# Patient Record
Sex: Female | Born: 1970 | Race: Black or African American | Hispanic: No | Marital: Single | Smoking: Never smoker
Health system: Southern US, Community
[De-identification: ages and names within clinical notes are randomized; demographics above are authoritative.]

---

## 1999-11-20 ENCOUNTER — Emergency Department (HOSPITAL_COMMUNITY): Admission: EM | Admit: 1999-11-20 | Discharge: 1999-11-20 | Payer: Self-pay | Admitting: Emergency Medicine

## 1999-11-20 ENCOUNTER — Encounter: Payer: Self-pay | Admitting: Internal Medicine

## 2000-08-19 ENCOUNTER — Emergency Department (HOSPITAL_COMMUNITY): Admission: EM | Admit: 2000-08-19 | Discharge: 2000-08-19 | Payer: Self-pay | Admitting: Emergency Medicine

## 2000-08-19 ENCOUNTER — Encounter: Payer: Self-pay | Admitting: Emergency Medicine

## 2000-08-22 ENCOUNTER — Emergency Department (HOSPITAL_COMMUNITY): Admission: EM | Admit: 2000-08-22 | Discharge: 2000-08-22 | Payer: Self-pay | Admitting: Emergency Medicine

## 2000-12-16 ENCOUNTER — Emergency Department (HOSPITAL_COMMUNITY): Admission: EM | Admit: 2000-12-16 | Discharge: 2000-12-16 | Payer: Self-pay | Admitting: *Deleted

## 2007-06-24 ENCOUNTER — Emergency Department (HOSPITAL_COMMUNITY): Admission: EM | Admit: 2007-06-24 | Discharge: 2007-06-24 | Payer: Self-pay | Admitting: Emergency Medicine
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
## 2014-03-27 ENCOUNTER — Encounter (HOSPITAL_COMMUNITY): Payer: Self-pay | Admitting: Emergency Medicine

## 2014-03-27 ENCOUNTER — Emergency Department (INDEPENDENT_AMBULATORY_CARE_PROVIDER_SITE_OTHER)
Admission: EM | Admit: 2014-03-27 | Discharge: 2014-03-27 | Disposition: A | Discharge status: Home / Self Care | Payer: Self-pay | Source: Home / Self Care | Attending: Family Medicine | Admitting: Family Medicine

## 2014-03-27 DIAGNOSIS — T6591XA Toxic effect of unspecified substance, accidental (unintentional), initial encounter: Secondary | ICD-10-CM

## 2014-03-27 DIAGNOSIS — T148 Other injury of unspecified body region: Secondary | ICD-10-CM

## 2014-03-27 DIAGNOSIS — L253 Unspecified contact dermatitis due to other chemical products: Secondary | ICD-10-CM

## 2014-03-27 DIAGNOSIS — L245 Irritant contact dermatitis due to other chemical products: Secondary | ICD-10-CM

## 2014-03-27 DIAGNOSIS — W57XXXA Bitten or stung by nonvenomous insect and other nonvenomous arthropods, initial encounter: Secondary | ICD-10-CM

## 2014-03-27 MED ORDER — METHYLPREDNISOLONE ACETATE 80 MG/ML IJ SUSP
INTRAMUSCULAR | Status: AC
  Filled 2014-03-27: qty 1

## 2014-03-27 MED ORDER — TRIAMCINOLONE ACETONIDE 40 MG/ML IJ SUSP
INTRAMUSCULAR | Status: AC
  Filled 2014-03-27: qty 1

## 2014-03-27 MED ORDER — METHYLPREDNISOLONE ACETATE 40 MG/ML IJ SUSP
80.0000 mg | Freq: Once | INTRAMUSCULAR | Status: AC
  Administered 2014-03-27: 80 mg via INTRAMUSCULAR

## 2014-03-27 MED ORDER — TRIAMCINOLONE ACETONIDE 40 MG/ML IJ SUSP
40.0000 mg | Freq: Once | INTRAMUSCULAR | Status: AC
  Administered 2014-03-27: 40 mg via INTRAMUSCULAR

## 2014-03-27 MED ORDER — PREDNISONE 50 MG PO TABS: ORAL_TABLET | ORAL | Status: DC

## 2014-03-27 MED ORDER — HYDROXYZINE HCL 25 MG PO TABS: 25.0000 mg | ORAL_TABLET | Freq: Four times a day (QID) | ORAL | Status: DC

## 2014-03-27 NOTE — ED Notes (Signed)
After treating her home w a spray for bedbugs, she has developed generalized skin problem w itchy rash

## 2014-03-27 NOTE — ED Provider Notes (Signed)
CSN: [id number]     Arrival date & time 03/27/14  4098 History   First MD Initiated Contact with Patient 03/27/14 [phone]     Chief Complaint  Patient presents with  . Rash   (Consider location/radiation/quality/duration/timing/severity/associated sxs/prior Treatment) Patient is a 43 y.o. female presenting with rash. The history is provided by the patient.  Rash Location:  Full body Quality: dryness and itchiness   Severity:  Mild Onset quality:  Gradual Duration:  2 days Progression:  Spreading Chronicity:  New Context comment:  Recent problem with bedbugs and used chemical spray for same and now rash is worse.   History reviewed. No pertinent past medical history. History reviewed. No pertinent past surgical history. History reviewed. No pertinent family history. History  Substance Use Topics  . Smoking status: Never Smoker   . Smokeless tobacco: Not on file  . Alcohol Use: No   OB History   Grav Para Term Preterm Abortions TAB SAB Ect Mult Living                 Review of Systems  Constitutional: Negative.   Skin: Positive for rash.    Allergies  Review of patient's allergies indicates no known allergies.  Home Medications   Prior to Admission medications   Not on File   BP 120/77  Pulse 75  Temp(Src) 98.2 F (36.8 C) (Oral)  Resp 16  SpO2 98% Physical Exam  Nursing note and vitals reviewed. Constitutional: She is oriented to person, place, and time. She appears well-developed and well-nourished.  Cardiovascular: Normal heart sounds.   Pulmonary/Chest: Effort normal and breath sounds normal. No respiratory distress.  Lymphadenopathy:    She has no cervical adenopathy.  Neurological: She is alert and oriented to person, place, and time.  Skin: Skin is warm and dry. Rash noted. There is erythema.  Scattered papular and confluent erythema to ext.     ED Course  Procedures (including critical care time) Labs Review Labs Reviewed - No data to  display  Imaging Review No results found.   MDM  No diagnosis found.     Linna Hoff, MD 03/27/14 1101

## 2019-08-25 ENCOUNTER — Ambulatory Visit (INDEPENDENT_AMBULATORY_CARE_PROVIDER_SITE_OTHER): Payer: Self-pay | Admitting: Primary Care

## 2019-08-25 ENCOUNTER — Encounter (INDEPENDENT_AMBULATORY_CARE_PROVIDER_SITE_OTHER): Payer: Self-pay | Admitting: Primary Care

## 2019-08-25 ENCOUNTER — Other Ambulatory Visit: Payer: Self-pay

## 2019-08-25 VITALS — BP 156/103 | HR 78 | Temp 97.5°F | Ht 68.5 in | Wt 388.4 lb

## 2019-08-25 DIAGNOSIS — Z114 Encounter for screening for human immunodeficiency virus [HIV]: Secondary | ICD-10-CM

## 2019-08-25 DIAGNOSIS — Z7689 Persons encountering health services in other specified circumstances: Secondary | ICD-10-CM

## 2019-08-25 DIAGNOSIS — Z0000 Encounter for general adult medical examination without abnormal findings: Secondary | ICD-10-CM

## 2019-08-25 DIAGNOSIS — M79605 Pain in left leg: Secondary | ICD-10-CM

## 2019-08-25 DIAGNOSIS — Z6841 Body Mass Index (BMI) 40.0 and over, adult: Secondary | ICD-10-CM

## 2019-08-25 DIAGNOSIS — Z131 Encounter for screening for diabetes mellitus: Secondary | ICD-10-CM

## 2019-08-25 DIAGNOSIS — M79604 Pain in right leg: Secondary | ICD-10-CM

## 2019-08-25 DIAGNOSIS — R030 Elevated blood-pressure reading, without diagnosis of hypertension: Secondary | ICD-10-CM

## 2019-08-25 DIAGNOSIS — Z124 Encounter for screening for malignant neoplasm of cervix: Secondary | ICD-10-CM

## 2019-08-25 DIAGNOSIS — Z1231 Encounter for screening mammogram for malignant neoplasm of breast: Secondary | ICD-10-CM

## 2019-08-25 DIAGNOSIS — Z1211 Encounter for screening for malignant neoplasm of colon: Secondary | ICD-10-CM

## 2019-08-25 LAB — POCT GLYCOSYLATED HEMOGLOBIN (HGB A1C): Hemoglobin A1C: 5.4 % (ref 4.0–5.6)

## 2019-08-25 MED ORDER — IBUPROFEN 800 MG PO TABS: 600.0000 mg | ORAL_TABLET | Freq: Three times a day (TID) | ORAL | 1 refills | Status: DC | PRN

## 2019-08-25 MED ORDER — HYDROCHLOROTHIAZIDE 25 MG PO TABS: 25.0000 mg | ORAL_TABLET | Freq: Every day | ORAL | 3 refills | Status: DC

## 2019-08-25 NOTE — Patient Instructions (Signed)

## 2019-08-25 NOTE — Progress Notes (Signed)
New Patient Office Visit  Subjective:  Patient ID: Tonya Barrett, female    DOB: [birth year]  Age: 49 y.o. MRN: [id number]  CC:  Chief Complaint  Patient presents with  . New Patient (Initial Visit)    pain in legs and shortness of breath     HPI Tonya Barrett presents for establishment of care and voices concerns with shortness of breath and bilateral leg pain.  History reviewed. No pertinent past medical history.  History reviewed. No pertinent surgical history.  Family History  Problem Relation Age of Onset  . Diabetes Mother   . Diabetes Sister     Social History   Socioeconomic History  . Marital status: Single    Spouse name: Not on file  . Number of children: Not on file  . Years of education: Not on file  . Highest education level: Not on file  Occupational History  . Not on file  Tobacco Use  . Smoking status: Never Smoker  . Smokeless tobacco: Never Used  Substance and Sexual Activity  . Alcohol use: No  . Drug use: Never  . Sexual activity: Not Currently  Other Topics Concern  . Not on file  Social History Narrative  . Not on file   Social Determinants of Health   Financial Resource Strain:   . Difficulty of Paying Living Expenses: Not on file  Food Insecurity:   . Worried About Charity fundraiser in the Last Year: Not on file  . Ran Out of Food in the Last Year: Not on file  Transportation Needs:   . Lack of Transportation (Medical): Not on file  . Lack of Transportation (Non-Medical): Not on file  Physical Activity:   . Days of Exercise per Week: Not on file  . Minutes of Exercise per Session: Not on file  Stress:   . Feeling of Stress : Not on file  Social Connections:   . Frequency of Communication with Friends and Family: Not on file  . Frequency of Social Gatherings with Friends and Family: Not on file  . Attends Religious Services: Not on file  . Active Member of Clubs or Organizations: Not on file  . Attends Theatre manager Meetings: Not on file  . Marital Status: Not on file  Intimate Partner Violence:   . Fear of Current or Ex-Partner: Not on file  . Emotionally Abused: Not on file  . Physically Abused: Not on file  . Sexually Abused: Not on file    ROS Review of Systems  Respiratory: Positive for shortness of breath.   Musculoskeletal:       Leg pain  All other systems reviewed and are negative.   Objective:   Today's Vitals: BP (!) 156/103 (BP Location: Right Arm, Patient Position: Sitting, Cuff Size: Large)   Pulse 78   Temp (!) 97.5 F (36.4 C) (Temporal)   Ht 5' 8.5" (1.74 m)   Wt (!) 388 lb 6.4 oz (176.2 kg)   LMP 06/17/2019 (Exact Date)   SpO2 95%   BMI 58.19 kg/m   Physical Exam Vitals reviewed.  Constitutional:      Appearance: Normal appearance. She is obese.  HENT:     Head: Normocephalic.     Right Ear: Tympanic membrane normal.     Left Ear: Tympanic membrane normal.  Cardiovascular:     Rate and Rhythm: Normal rate and regular rhythm.  Pulmonary:     Effort: Pulmonary effort is normal.  Breath sounds: Normal breath sounds.  Abdominal:     General: Bowel sounds are normal.     Palpations: Abdomen is soft.  Musculoskeletal:        General: Normal range of motion.     Cervical back: Normal range of motion and neck supple.  Skin:    General: Skin is warm and dry.  Neurological:     Mental Status: She is alert and oriented to person, place, and time.  Psychiatric:        Mood and Affect: Mood normal.        Behavior: Behavior normal.        Thought Content: Thought content normal.        Judgment: Judgment normal.     Assessment & Plan:  Tonya Barrett was seen today for new patient (initial visit).  Diagnoses and all orders for this visit:  Screening for diabetes mellitus -     HgB A1c 5.4 -     CBC with Differential  Encounter to establish care Tonya Mire, NP-C will be your  (PCP) she is mastered prepared . She is skilled to diagnosed and  treat illness. Also able to answer health concern as well as continuing care of varied medical conditions, not limited by cause, organ system, or diagnosis.   Bilateral leg pain Work on losing weight to help reduce joint pain. May alternate with heat and ice application for pain relief. May also alternate with acetaminophen and Ibuprofen as prescribed pain relief. Other alternatives include massage, acupuncture and water aerobics.  You must stay active and avoid a sedentary lifestyle.  Morbid obesity ([place]) Morbid Obesity is </= 40  BMI indicating an excess in caloric intake or underlining conditions. This may lead to other co-morbidities increase risk for diabetes, CVD, respiratory complications and hypertension . Lifestyle modifications of diet and exercise may reduce obesity.  -     Lipid Panel -     TSH + free T4  Elevated blood pressure reading without diagnosis of hypertension Discussed measures that can lower blood pressure </= 130/80. Low sodiums diet , snacks  low-sodium, DASH diet, 150 minutes of moderate intensity exercise per week. Discussed medication compliance, adverse effects.  -     CMP14+EGFR  Colon cancer screening Normal colon cancer screening.  CDC recommends colorectal screening from ages 22-75. Screening can begin at 38 or earlier in some cases. This screening is used for a disease when no symptoms are present . Diagnostic test is used for symptoms examples blood in stool, colorectal polyps or coloector cancer, family history or inflammatory bowel disease - chron's or ulcerative colitis .(USPSTF) -     Fecal occult blood, imunochemical; Future  Encounter for screening mammogram for malignant neoplasm of breast Patient completed application for BCCP while in clinic and application has and fax[facility] to [facility]. Patient aware that [facility] will contact her directly to schedule appointment.  Screening for cervical cancer Patient completed application for BCCP while in clinic and  application has and faxed to [facility]. Patient aware that [facility] will contact her directly to schedule appointment.  Encounter for screening for HIV Quality metric  -     HIV Antibody (routine testing w rflx)  Other orders -     hydrochlorothiazide (HYDRODIURIL) 25 MG tablet; Take 1 tablet (25 mg total) by mouth daily. -     ibuprofen (ADVIL) 800 MG tablet; Take 1 tablet (800 mg total) by mouth every 8 (eight) hours as needed.    Follow-up: Return in  about 4 weeks (around 09/22/2019) for tele Bp follow up.   Kerin Perna, NP

## 2019-08-26 LAB — CMP14+EGFR
ALT: 26 IU/L (ref 0–32)
AST: 25 IU/L (ref 0–40)
Albumin/Globulin Ratio: 1.3 (ref 1.2–2.2)
Albumin: 4.3 g/dL (ref 3.8–4.8)
Alkaline Phosphatase: 81 IU/L (ref 39–117)
BUN/Creatinine Ratio: 11 (ref 9–23)
BUN: 9 mg/dL (ref 6–24)
Bilirubin Total: 0.8 mg/dL (ref 0.0–1.2)
CO2: 19 mmol/L — ABNORMAL LOW (ref 20–29)
Calcium: 9.5 mg/dL (ref 8.7–10.2)
Chloride: 102 mmol/L (ref 96–106)
Creatinine, Ser: 0.79 mg/dL (ref 0.57–1.00)
GFR calc Af Amer: 101 mL/min/{1.73_m2} (ref >59)
GFR calc non Af Amer: 88 mL/min/{1.73_m2} (ref >59)
Globulin, Total: 3.3 g/dL (ref 1.5–4.5)
Glucose: 116 mg/dL — ABNORMAL HIGH (ref 65–99)
Potassium: 4.2 mmol/L (ref 3.5–5.2)
Sodium: 140 mmol/L (ref 134–144)
Total Protein: 7.6 g/dL (ref 6.0–8.5)

## 2019-08-26 LAB — TSH+FREE T4
Free T4: 0.98 ng/dL (ref 0.82–1.77)
TSH: 2 u[IU]/mL (ref 0.450–4.500)

## 2019-08-26 LAB — CBC WITH DIFFERENTIAL/PLATELET
Basophils Absolute: 0 10*3/uL (ref 0.0–0.2)
Basos: 0 %
EOS (ABSOLUTE): 0.2 10*3/uL (ref 0.0–0.4)
Eos: 3 %
Hematocrit: 39.8 % (ref 34.0–46.6)
Hemoglobin: 13.7 g/dL (ref 11.1–15.9)
Immature Grans (Abs): 0 10*3/uL (ref 0.0–0.1)
Immature Granulocytes: 1 %
Lymphocytes Absolute: 1.9 10*3/uL (ref 0.7–3.1)
Lymphs: 33 %
MCH: 30.5 pg (ref 26.6–33.0)
MCHC: 34.4 g/dL (ref 31.5–35.7)
MCV: 89 fL (ref 79–97)
Monocytes Absolute: 0.6 10*3/uL (ref 0.1–0.9)
Monocytes: 11 %
Neutrophils Absolute: 3 10*3/uL (ref 1.4–7.0)
Neutrophils: 52 %
Platelets: 311 10*3/uL (ref 150–450)
RBC: 4.49 x10E6/uL (ref 3.77–5.28)
RDW: 14.4 % (ref 11.7–15.4)
WBC: 5.8 10*3/uL (ref 3.4–10.8)

## 2019-08-26 LAB — LIPID PANEL
Chol/HDL Ratio: 4.6 ratio — ABNORMAL HIGH (ref 0.0–4.4)
Cholesterol, Total: 267 mg/dL — ABNORMAL HIGH (ref 100–199)
HDL: 58 mg/dL (ref >39)
LDL Chol Calc (NIH): 187 mg/dL — ABNORMAL HIGH (ref 0–99)
Triglycerides: 121 mg/dL (ref 0–149)
VLDL Cholesterol Cal: 22 mg/dL (ref 5–40)

## 2019-08-26 LAB — HIV ANTIBODY (ROUTINE TESTING W REFLEX): HIV Screen 4th Generation wRfx: NONREACTIVE

## 2019-08-26 NOTE — Addendum Note (Signed)
Addended by: Mirian Mo on: 08/26/2019 01:42 PM   Modules accepted: Orders

## 2019-08-27 LAB — FECAL OCCULT BLOOD, IMMUNOCHEMICAL: Fecal Occult Bld: NEGATIVE

## 2019-08-29 ENCOUNTER — Other Ambulatory Visit (INDEPENDENT_AMBULATORY_CARE_PROVIDER_SITE_OTHER): Payer: Self-pay | Admitting: Primary Care

## 2019-08-29 ENCOUNTER — Telehealth (INDEPENDENT_AMBULATORY_CARE_PROVIDER_SITE_OTHER): Payer: Self-pay

## 2019-08-29 MED ORDER — ATORVASTATIN CALCIUM 40 MG PO TABS: 40.0000 mg | ORAL_TABLET | Freq: Every day | ORAL | 1 refills | Status: DC

## 2019-08-29 NOTE — Telephone Encounter (Signed)
Patient verified date of birth. She is aware that labs were normal except elevated cholesterol which can lead to stroke and heart attack. She is aware that atorvastatin has been sent to her pharmacy to help lower her cholesterol. She was also advised on dietary changes to make such as decreasing fatty food, red meat, cheese and milk and increase whole grains and vegetables. She verbalized understanding of results. Maryjean Morn, CMA

## 2019-08-29 NOTE — Telephone Encounter (Signed)
-----   Message from Grayce Sessions, NP sent at 08/29/2019 11:58 AM EST ----- Reviewed  labs all normal except .Cholesterol that can lead to heart attack and stroke. To lower your number you can decrease your fatty foods, red meat, cheese, milk and increase fiber like whole grains and veggies. Sent in Atorvastatin 40 mg Qpm

## 2019-09-22 ENCOUNTER — Other Ambulatory Visit: Payer: Self-pay

## 2019-09-22 ENCOUNTER — Encounter (INDEPENDENT_AMBULATORY_CARE_PROVIDER_SITE_OTHER): Payer: Self-pay | Admitting: Primary Care

## 2019-09-22 ENCOUNTER — Ambulatory Visit (INDEPENDENT_AMBULATORY_CARE_PROVIDER_SITE_OTHER): Payer: Self-pay | Admitting: Primary Care

## 2019-09-22 DIAGNOSIS — M79604 Pain in right leg: Secondary | ICD-10-CM

## 2019-09-22 DIAGNOSIS — M79605 Pain in left leg: Secondary | ICD-10-CM

## 2019-09-22 NOTE — Progress Notes (Signed)
Pt has not checked Bp this am Pt has to purchase Bp cuff

## 2019-09-22 NOTE — Progress Notes (Signed)
Virtual Visit via Telephone Note  I connected with Tonya Barrett on 09/22/19 at  9:50 AM EST by telephone and verified that I am speaking with the correct person using two identifiers.   I discussed the limitations, risks, security and privacy concerns of performing an evaluation and management service by telephone and the availability of in person appointments. I also discussed with the patient that there may be a patient responsible charge related to this service. The patient expressed understanding and agreed to proceed.   History of Present Illness: Tonya Barrett is having a tele visit follow up of blood pressure . Unable to check Bp has not purchase a blood pressure monitor. She is on HCTZ 25mg  . She does complain of bilateral leg pain increasing pain when standing a long period of time. We discussed weight can be a factor. No past medical history on file.  Current Outpatient Medications on File Prior to Visit  Medication Sig Dispense Refill  . atorvastatin (LIPITOR) 40 MG tablet Take 1 tablet (40 mg total) by mouth daily. 90 tablet 1  . hydrochlorothiazide (HYDRODIURIL) 25 MG tablet Take 1 tablet (25 mg total) by mouth daily. 90 tablet 3  . ibuprofen (ADVIL) 800 MG tablet Take 1 tablet (800 mg total) by mouth every 8 (eight) hours as needed. 90 tablet 1   No current facility-administered medications on file prior to visit.   Observations/Objective: Review of Systems  Musculoskeletal: Positive for joint pain.       Bilateral knee pain  All other systems reviewed and are negative.   Assessment and Plan: Tonya Barrett was seen today for blood pressure check.  Diagnoses and all orders for this visit:  Morbid obesity (HCC) Morbid Obesity is >/= 40  indicating an excess in caloric intake or underlining conditions. This may lead to other co-morbidities-HTN, DM and respiratory complications  Lifestyle modifications of diet and exercise may reduce obesity.   Bilateral leg pain Work  on losing weight to help reduce joint pain. May alternate with heat and ice application for pain relief. May also alternate with acetaminophen and Ibuprofen as prescribed pain relief. Other alternatives include massage, acupuncture and water aerobics.  You must stay active and avoid a sedentary lifestyle.    Follow Up Instructions:    I discussed the assessment and treatment plan with the patient. The patient was provided an opportunity to ask questions and all were answered. The patient agreed with the plan and demonstrated an understanding of the instructions.   The patient was advised to call back or seek an in-person evaluation if the symptoms worsen or if the condition fails to improve as anticipated.  I provided 10 minutes of non-face-to-face time during this encounter.   Aram Beecham, NP

## 2019-10-16 ENCOUNTER — Ambulatory Visit (INDEPENDENT_AMBULATORY_CARE_PROVIDER_SITE_OTHER): Payer: Self-pay | Admitting: Primary Care

## 2019-10-30 ENCOUNTER — Ambulatory Visit (INDEPENDENT_AMBULATORY_CARE_PROVIDER_SITE_OTHER): Payer: Self-pay | Admitting: Primary Care

## 2020-02-10 ENCOUNTER — Other Ambulatory Visit: Payer: Self-pay

## 2020-02-10 ENCOUNTER — Encounter (INDEPENDENT_AMBULATORY_CARE_PROVIDER_SITE_OTHER): Payer: Self-pay | Admitting: Primary Care

## 2020-02-10 ENCOUNTER — Ambulatory Visit (INDEPENDENT_AMBULATORY_CARE_PROVIDER_SITE_OTHER): Payer: Self-pay | Admitting: Primary Care

## 2020-02-10 VITALS — BP 128/87 | HR 74 | Temp 98.0°F | Ht 68.0 in | Wt 385.2 lb

## 2020-02-10 DIAGNOSIS — E662 Morbid (severe) obesity with alveolar hypoventilation: Secondary | ICD-10-CM

## 2020-02-10 DIAGNOSIS — R062 Wheezing: Secondary | ICD-10-CM

## 2020-02-10 DIAGNOSIS — Z23 Encounter for immunization: Secondary | ICD-10-CM

## 2020-02-10 DIAGNOSIS — N921 Excessive and frequent menstruation with irregular cycle: Secondary | ICD-10-CM

## 2020-02-10 DIAGNOSIS — I10 Essential (primary) hypertension: Secondary | ICD-10-CM

## 2020-02-10 MED ORDER — HYDROCHLOROTHIAZIDE 25 MG PO TABS: 25.0000 mg | ORAL_TABLET | Freq: Every day | ORAL | 3 refills | Status: DC

## 2020-02-10 MED ORDER — IBUPROFEN 800 MG PO TABS: 800.0000 mg | ORAL_TABLET | Freq: Three times a day (TID) | ORAL | 1 refills | Status: DC | PRN

## 2020-02-10 MED ORDER — ALBUTEROL SULFATE HFA 108 (90 BASE) MCG/ACT IN AERS: 2.0000 | INHALATION_SPRAY | Freq: Four times a day (QID) | RESPIRATORY_TRACT | 1 refills | Status: DC | PRN

## 2020-02-10 NOTE — Patient Instructions (Signed)
Obesity Hypoventilation Syndrome  Obesity hypoventilation syndrome (OHS) means that you are not breathing well enough to get air in and out of your lungs efficiently (ventilation). This causes a low oxygen level and a high carbon dioxide level in your blood (hypoventilation). Having too much total body fat (obesity) is a significant risk factor for developing OHS. OHS makes it harder for your heart to pump oxygen-rich blood to your body. It can cause sleep disturbances and make you feel sleepy during the day. Over time, OHS can increase your risk for:  Heart disease.  High blood pressure (hypertension).  Reduced ability to absorb sugar from the bloodstream (insulin resistance).  Heart failure. Over time, OHS weakens your heart and can lead to heart failure. What are the causes? The exact cause of OHS is not known. Possible causes include:  Pressure on the lungs from excess body weight.  Obesity-related changes in how much air the lungs can hold (lung capacity) and how much they can expand (lung compliance).  Failure of the brain to regulate oxygen and carbon dioxide levels properly.  Chemicals (hormones) produced by excess fat cells interfering with breathing regulation.  A breathing condition in which breathing pauses or becomes shallow during sleep (sleep apnea). This condition can eventually cause the body to ventilate poorly and to hold onto carbon dioxide during the day. What increases the risk? You may have a greater risk for OHS if you:  Have a BMI of 30 or higher. BMI is an estimate of body fat that is calculated from height and weight. For adults, a BMI of 30 or higher is considered obese.  Are 40?51 years old.  Carry most of your excess weight around your waist.  Experience moderate symptoms of sleep apnea. What are the signs or symptoms? The most common symptoms of OHS are:  Daytime sleepiness.  Lack of energy.  Shortness of breath.  Morning headaches.  Sleep  apnea.  Trouble concentrating.  Irritability, mood swings, or depression.  Swollen veins in the neck.  Swelling of the legs. How is this diagnosed? Your health care provider may suspect OHS if you are obese and have poor breathing during the day and at night. Your health care provider will also do a physical exam. You may have tests to:  Measure your BMI.  Measure your blood oxygen level with a sensor placed on your finger (pulse oximetry).  Measure blood oxygen and carbon dioxide in a blood sample.  Measure the amount of red blood cells in a blood sample. OHS causes the number of red blood cells you have to increase (polycythemia).  Check your breathing ability (pulmonary function testing).  Check your breathing ability, breathing patterns, and oxygen level while you sleep (sleep study). You may also have a chest X-ray to rule out other breathing problems. You may have an electrocardiogram (ECG) and or echocardiogram to check for signs of heart failure. How is this treated? Weight loss is the most important part of treatment for OHS, and it may be the only treatment that you need. Other treatments may include:  Using a device to open your airway while you sleep, such as a continuous positive airway pressure (CPAP) machine that delivers oxygen to your airway through a mask.  Surgery (gastric bypass surgery) to lower your BMI. This may be needed if: ? You are very obese. ? Other treatments have not worked for you. ? Your OHS is very severe and is causing organ damage, such as heart failure. Follow these   instructions at home:  Medicines  Take over-the-counter and prescription medicines only as told by your health care provider.  Ask your health care provider what medicines are safe for you. You may be told to avoid medicines that can impair breathing and make OHS worse, such as sedatives and narcotics. Sleeping habits  If you are prescribed a CPAP machine, make sure you  understand and use the machine as directed.  Try to get 8 hours of sleep every night.  Go to bed at the same time every night, and get up at the same time every day. General instructions  Work with your health care provider to make a diet and exercise plan that helps you reach and maintain a healthy weight.  Eat a healthy diet.  Avoid smoking.  Exercise regularly as told by your health care provider.  During the evening, do not drink caffeine and do not eat heavy meals.  Keep all follow-up visits as told by your health care provider. This is important. Contact a health care provider if:  You experience new or worsening shortness of breath.  You have chest pain.  You have an irregular heartbeat (palpitations).  You have dizziness.  You faint.  You develop a cough.  You have a fever.  You have chest pain when you breathe (pleurisy). This information is not intended to replace advice given to you by your health care provider. Make sure you discuss any questions you have with your health care provider. Document Revised: 10/25/2018 Document Reviewed: 12/13/2015 Elsevier Patient Education  2020 [employer]  

## 2020-02-10 NOTE — Progress Notes (Signed)
Acute Office Visit  Subjective:    Patient ID: Tonya Barrett, female    DOB: 1970/11/15, 49 y.o.   MRN: [id number]  Chief Complaint  Patient presents with  . Wheezing  . Leg Pain    HPI Tonya Barrett is a 49 year old morbid obese female presents  with wheezing , shortness of breath and bilateral  leg pain.  Hypertension is close to goal denies  headaches, chest pain or lower extremity edema   No past medical history on file.  No past surgical history on file.  Family History  Problem Relation Age of Onset  . Diabetes Mother   . Diabetes Sister     Social History   Socioeconomic History  . Marital status: Single    Spouse name: Not on file  . Number of children: Not on file  . Years of education: Not on file  . Highest education level: Not on file  Occupational History  . Not on file  Tobacco Use  . Smoking status: Never Smoker  . Smokeless tobacco: Never Used  Substance and Sexual Activity  . Alcohol use: No  . Drug use: Never  . Sexual activity: Not Currently  Other Topics Concern  . Not on file  Social History Narrative  . Not on file   Social Determinants of Health   Financial Resource Strain:   . Difficulty of Paying Living Expenses:   Food Insecurity:   . Worried About Charity fundraiser in the Last Year:   . Arboriculturist in the Last Year:   Transportation Needs:   . Film/video editor (Medical):   Marland Kitchen Lack of Transportation (Non-Medical):   Physical Activity:   . Days of Exercise per Week:   . Minutes of Exercise per Session:   Stress:   . Feeling of Stress :   Social Connections:   . Frequency of Communication with Friends and Family:   . Frequency of Social Gatherings with Friends and Family:   . Attends Religious Services:   . Active Member of Clubs or Organizations:   . Attends Archivist Meetings:   Marland Kitchen Marital Status:   Intimate Partner Violence:   . Fear of Current or Ex-Partner:   . Emotionally Abused:    Marland Kitchen Physically Abused:   . Sexually Abused:     Outpatient Medications Prior to Visit  Medication Sig Dispense Refill  . atorvastatin (LIPITOR) 40 MG tablet Take 1 tablet (40 mg total) by mouth daily. 90 tablet 1  . hydrochlorothiazide (HYDRODIURIL) 25 MG tablet Take 1 tablet (25 mg total) by mouth daily. 90 tablet 3  . ibuprofen (ADVIL) 800 MG tablet Take 1 tablet (800 mg total) by mouth every 8 (eight) hours as needed. 90 tablet 1   No facility-administered medications prior to visit.    No Known Allergies  Review of Systems  Respiratory: Positive for cough, shortness of breath and wheezing.   All other systems reviewed and are negative.      Objective:    Physical Exam Vitals reviewed.  Constitutional:      Comments: morbid  HENT:     Head: Normocephalic.     Right Ear: Tympanic membrane normal.     Left Ear: Tympanic membrane normal.  Eyes:     Extraocular Movements: Extraocular movements intact.     Pupils: Pupils are equal, round, and reactive to light.  Cardiovascular:     Rate and Rhythm: Normal rate and regular rhythm.  Pulses: Normal pulses.     Heart sounds: Normal heart sounds.  Pulmonary:     Effort: Pulmonary effort is normal.     Breath sounds: Normal breath sounds.  Abdominal:     General: Bowel sounds are normal.  Musculoskeletal:        General: Swelling present. Normal range of motion.  Skin:    General: Skin is warm and dry.  Neurological:     Mental Status: She is alert and oriented to person, place, and time.  Psychiatric:        Mood and Affect: Mood normal.        Behavior: Behavior normal.        Thought Content: Thought content normal.        Judgment: Judgment normal.     Ht 5' 8"  (1.727 m)   Wt (!) 385 lb 3.2 oz (174.7 kg)   BMI 58.57 kg/m  Wt Readings from Last 3 Encounters:  02/10/20 (!) 385 lb 3.2 oz (174.7 kg)  08/25/19 (!) 388 lb 6.4 oz (176.2 kg)    Health Maintenance Due  Topic Date Due  . Hepatitis C Screening   Never done  . COVID-19 Vaccine (1) Never done  . TETANUS/TDAP  Never done  . PAP SMEAR-Modifier  Never done  . MAMMOGRAM  Never done  . COLONOSCOPY  Never done    There are no preventive care reminders to display for this patient.   Lab Results  Component Value Date   TSH 2.000 08/25/2019   Lab Results  Component Value Date   WBC 5.8 08/25/2019   HGB 13.7 08/25/2019   HCT 39.8 08/25/2019   MCV 89 08/25/2019   PLT 311 08/25/2019   Lab Results  Component Value Date   NA 140 08/25/2019   K 4.2 08/25/2019   CO2 19 (L) 08/25/2019   GLUCOSE 116 (H) 08/25/2019   BUN 9 08/25/2019   CREATININE 0.79 08/25/2019   BILITOT 0.8 08/25/2019   ALKPHOS 81 08/25/2019   AST 25 08/25/2019   ALT 26 08/25/2019   PROT 7.6 08/25/2019   ALBUMIN 4.3 08/25/2019   CALCIUM 9.5 08/25/2019   Lab Results  Component Value Date   CHOL 267 (H) 08/25/2019   Lab Results  Component Value Date   HDL 58 08/25/2019   Lab Results  Component Value Date   LDLCALC 187 (H) 08/25/2019   Lab Results  Component Value Date   TRIG 121 08/25/2019   Lab Results  Component Value Date   CHOLHDL 4.6 (H) 08/25/2019   Lab Results  Component Value Date   HGBA1C 5.4 08/25/2019       Assessment & Plan:   Tonya Barrett was seen today for wheezing and leg pain.  Diagnoses and all orders for this visit:  Need for Tdap vaccination -     Tdap vaccine greater than or equal to 7yo IM  Wheezing -     albuterol (VENTOLIN HFA) 108 (90 Base) MCG/ACT inhaler; Inhale 2 puffs into the lungs every 6 (six) hours as needed for wheezing or shortness of breath.  Hypoventilation associated with obesity syndrome (HCC) -     albuterol (VENTOLIN HFA) 108 (90 Base) MCG/ACT inhaler; Inhale 2 puffs into the lungs every 6 (six) hours as needed for wheezing or shortness of breath.  Morbid obesity ([place]) Morbid Obesity is > 40 Body mass index is 58.57 kg/m. indicating an excess in caloric intake or underlining conditions. This may  lead to other co-morbidities.  Complications that already are present hypoventilation syndrome , pain in legs and wrist for type 2 diabetes lifestyle modifications of diet and exercise may reduce obesity.  -     Lipid Panel -     TSH + free T4 -     Hepatitis C Antibody  Essential hypertension Counseled on blood pressure goal of less than 130/80, low-sodium, DASH diet, medication compliance, 150 minutes of moderate intensity exercise per week. Discussed medication compliance, adverse effects. -     hydrochlorothiazide (HYDRODIURIL) 25 MG tablet; Take 1 tablet (25 mg total) by mouth daily. -     CMP14+EGFR  Menorrhagia with irregular cycle -     CBC with Differential  Other orders -     ibuprofen (ADVIL) 800 MG tablet; Take 1 tablet (800 mg total) by mouth every 8 (eight) hours as needed.     Kerin Perna, NP

## 2020-02-11 ENCOUNTER — Other Ambulatory Visit (INDEPENDENT_AMBULATORY_CARE_PROVIDER_SITE_OTHER): Payer: Self-pay | Admitting: Primary Care

## 2020-02-11 DIAGNOSIS — E782 Mixed hyperlipidemia: Secondary | ICD-10-CM

## 2020-02-11 LAB — CMP14+EGFR
ALT: 20 IU/L (ref 0–32)
AST: 22 IU/L (ref 0–40)
Albumin/Globulin Ratio: 1.6 (ref 1.2–2.2)
Albumin: 4.4 g/dL (ref 3.8–4.9)
Alkaline Phosphatase: 73 IU/L (ref 48–121)
BUN/Creatinine Ratio: 13 (ref 9–23)
BUN: 10 mg/dL (ref 6–24)
Bilirubin Total: 0.8 mg/dL (ref 0.0–1.2)
CO2: 23 mmol/L (ref 20–29)
Calcium: 9.3 mg/dL (ref 8.7–10.2)
Chloride: 100 mmol/L (ref 96–106)
Creatinine, Ser: 0.79 mg/dL (ref 0.57–1.00)
GFR calc Af Amer: 100 mL/min/{1.73_m2} (ref >59)
GFR calc non Af Amer: 87 mL/min/{1.73_m2} (ref >59)
Globulin, Total: 2.8 g/dL (ref 1.5–4.5)
Glucose: 125 mg/dL — ABNORMAL HIGH (ref 65–99)
Potassium: 3.7 mmol/L (ref 3.5–5.2)
Sodium: 140 mmol/L (ref 134–144)
Total Protein: 7.2 g/dL (ref 6.0–8.5)

## 2020-02-11 LAB — CBC WITH DIFFERENTIAL/PLATELET
Basophils Absolute: 0 10*3/uL (ref 0.0–0.2)
Basos: 0 %
EOS (ABSOLUTE): 0.2 10*3/uL (ref 0.0–0.4)
Eos: 4 %
Hematocrit: 39.8 % (ref 34.0–46.6)
Hemoglobin: 13.7 g/dL (ref 11.1–15.9)
Immature Grans (Abs): 0 10*3/uL (ref 0.0–0.1)
Immature Granulocytes: 1 %
Lymphocytes Absolute: 1.9 10*3/uL (ref 0.7–3.1)
Lymphs: 36 %
MCH: 30.9 pg (ref 26.6–33.0)
MCHC: 34.4 g/dL (ref 31.5–35.7)
MCV: 90 fL (ref 79–97)
Monocytes Absolute: 0.5 10*3/uL (ref 0.1–0.9)
Monocytes: 10 %
Neutrophils Absolute: 2.6 10*3/uL (ref 1.4–7.0)
Neutrophils: 49 %
Platelets: 262 10*3/uL (ref 150–450)
RBC: 4.44 x10E6/uL (ref 3.77–5.28)
RDW: 13.3 % (ref 11.7–15.4)
WBC: 5.3 10*3/uL (ref 3.4–10.8)

## 2020-02-11 LAB — HEPATITIS C ANTIBODY: Hep C Virus Ab: <0.1 s/co ratio (ref 0.0–0.9)

## 2020-02-11 LAB — TSH+FREE T4
Free T4: 1.03 ng/dL (ref 0.82–1.77)
TSH: 3.27 u[IU]/mL (ref 0.450–4.500)

## 2020-02-11 LAB — LIPID PANEL
Chol/HDL Ratio: 5.3 ratio — ABNORMAL HIGH (ref 0.0–4.4)
Cholesterol, Total: 260 mg/dL — ABNORMAL HIGH (ref 100–199)
HDL: 49 mg/dL (ref >39)
LDL Chol Calc (NIH): 190 mg/dL — ABNORMAL HIGH (ref 0–99)
Triglycerides: 119 mg/dL (ref 0–149)
VLDL Cholesterol Cal: 21 mg/dL (ref 5–40)

## 2020-02-11 MED ORDER — ATORVASTATIN CALCIUM 40 MG PO TABS: 40.0000 mg | ORAL_TABLET | Freq: Every day | ORAL | 1 refills | Status: DC

## 2020-02-12 ENCOUNTER — Telehealth (INDEPENDENT_AMBULATORY_CARE_PROVIDER_SITE_OTHER): Payer: Self-pay

## 2020-02-12 NOTE — Telephone Encounter (Signed)
Patient verified date of birth. She is aware that labs are normal except elevated cholesterol. Advised to ensure she takes atorvastatin nightly. Decrease fatty food, red meat, cheese, milk; increase veggies and whole grains. She verbalized understanding. Maryjean Morn, CMA

## 2020-02-12 NOTE — Telephone Encounter (Signed)
-----   Message from Grayce Sessions, NP sent at 02/11/2020  2:07 PM EDT ----- Hemoglobin /hematocrit , kidney function liver function are normal.  Cholesterol panel is elevated-cholesterol 260, LDL 190 .  LDL is considered the bad cholesterol that can lead to heart attack and stroke. Try to  decrease your fatty foods, red meat, cheese, milk and increase fiber like whole grains and veggies.    Refilled atorvastatin 40 mg take at bedtime

## 2020-02-24 ENCOUNTER — Telehealth (INDEPENDENT_AMBULATORY_CARE_PROVIDER_SITE_OTHER): Payer: Self-pay

## 2020-02-24 NOTE — Telephone Encounter (Signed)
Opened in error

## 2020-03-24 ENCOUNTER — Ambulatory Visit: Payer: Self-pay | Admitting: Critical Care Medicine

## 2020-04-27 ENCOUNTER — Ambulatory Visit: Payer: Self-pay | Admitting: Critical Care Medicine

## 2020-05-04 ENCOUNTER — Ambulatory Visit (INDEPENDENT_AMBULATORY_CARE_PROVIDER_SITE_OTHER): Payer: Self-pay | Admitting: Primary Care

## 2020-05-04 ENCOUNTER — Other Ambulatory Visit: Payer: Self-pay

## 2020-05-04 ENCOUNTER — Encounter (INDEPENDENT_AMBULATORY_CARE_PROVIDER_SITE_OTHER): Payer: Self-pay | Admitting: Primary Care

## 2020-05-04 VITALS — BP 148/88 | HR 81 | Temp 97.5°F | Ht 68.0 in | Wt 389.0 lb

## 2020-05-04 DIAGNOSIS — Z111 Encounter for screening for respiratory tuberculosis: Secondary | ICD-10-CM

## 2020-05-07 ENCOUNTER — Ambulatory Visit: Payer: Self-pay

## 2020-05-07 ENCOUNTER — Ambulatory Visit (INDEPENDENT_AMBULATORY_CARE_PROVIDER_SITE_OTHER): Payer: Self-pay

## 2020-05-07 ENCOUNTER — Other Ambulatory Visit: Payer: Self-pay

## 2020-05-07 DIAGNOSIS — Z111 Encounter for screening for respiratory tuberculosis: Secondary | ICD-10-CM

## 2020-05-07 LAB — TB SKIN TEST
Induration: 0 mm
TB Skin Test: NEGATIVE

## 2020-05-07 NOTE — Progress Notes (Signed)
Patient is here to get her TB placement read. TB placement on 05/04/2020. TB placement was placed on her right forearm.  TB reading negative, 0 mm for induration.  No redness or swelling.

## 2020-05-19 ENCOUNTER — Ambulatory Visit: Payer: Self-pay | Admitting: Critical Care Medicine

## 2020-07-01 ENCOUNTER — Ambulatory Visit: Payer: Self-pay | Admitting: Critical Care Medicine

## 2020-08-12 ENCOUNTER — Ambulatory Visit (INDEPENDENT_AMBULATORY_CARE_PROVIDER_SITE_OTHER): Payer: Self-pay | Admitting: Primary Care

## 2020-08-18 ENCOUNTER — Ambulatory Visit (INDEPENDENT_AMBULATORY_CARE_PROVIDER_SITE_OTHER): Payer: Self-pay | Admitting: Primary Care

## 2020-09-28 ENCOUNTER — Ambulatory Visit (INDEPENDENT_AMBULATORY_CARE_PROVIDER_SITE_OTHER): Payer: Self-pay | Admitting: Primary Care

## 2020-10-25 ENCOUNTER — Ambulatory Visit (INDEPENDENT_AMBULATORY_CARE_PROVIDER_SITE_OTHER): Payer: Self-pay | Admitting: Primary Care

## 2020-10-25 ENCOUNTER — Encounter (INDEPENDENT_AMBULATORY_CARE_PROVIDER_SITE_OTHER): Payer: Self-pay | Admitting: Primary Care

## 2020-10-25 ENCOUNTER — Other Ambulatory Visit: Payer: Self-pay

## 2020-10-25 DIAGNOSIS — F4323 Adjustment disorder with mixed anxiety and depressed mood: Secondary | ICD-10-CM

## 2020-10-25 DIAGNOSIS — R062 Wheezing: Secondary | ICD-10-CM

## 2020-10-25 DIAGNOSIS — S8000XS Contusion of unspecified knee, sequela: Secondary | ICD-10-CM

## 2020-10-25 DIAGNOSIS — M25562 Pain in left knee: Secondary | ICD-10-CM

## 2020-10-25 DIAGNOSIS — E782 Mixed hyperlipidemia: Secondary | ICD-10-CM

## 2020-10-25 DIAGNOSIS — I10 Essential (primary) hypertension: Secondary | ICD-10-CM

## 2020-10-25 DIAGNOSIS — E662 Morbid (severe) obesity with alveolar hypoventilation: Secondary | ICD-10-CM

## 2020-10-25 DIAGNOSIS — M25561 Pain in right knee: Secondary | ICD-10-CM

## 2020-10-25 DIAGNOSIS — G8929 Other chronic pain: Secondary | ICD-10-CM

## 2020-10-25 MED ORDER — ALBUTEROL SULFATE HFA 108 (90 BASE) MCG/ACT IN AERS: 2.0000 | INHALATION_SPRAY | Freq: Four times a day (QID) | RESPIRATORY_TRACT | 1 refills | Status: AC | PRN

## 2020-10-25 MED ORDER — HYDROCHLOROTHIAZIDE 25 MG PO TABS: 25.0000 mg | ORAL_TABLET | Freq: Every day | ORAL | 3 refills | Status: AC

## 2020-10-25 MED ORDER — ATORVASTATIN CALCIUM 40 MG PO TABS: 40.0000 mg | ORAL_TABLET | Freq: Every day | ORAL | 1 refills | Status: DC

## 2020-10-25 MED ORDER — IBUPROFEN 800 MG PO TABS: 800.0000 mg | ORAL_TABLET | Freq: Three times a day (TID) | ORAL | 1 refills | Status: DC | PRN

## 2020-10-25 MED ORDER — BUPROPION HCL ER (XL) 150 MG PO TB24: 150.0000 mg | ORAL_TABLET | Freq: Every day | ORAL | 1 refills | Status: AC

## 2020-10-25 MED ORDER — IBUPROFEN 800 MG PO TABS: 800.0000 mg | ORAL_TABLET | Freq: Three times a day (TID) | ORAL | 1 refills | Status: AC

## 2020-10-25 NOTE — Patient Instructions (Signed)
Place knee pain patient instructions here.  
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
## 2020-10-25 NOTE — Progress Notes (Signed)
Bilateral knee and leg pain  8/10 pain   Medication refill   Fall at home- tripped over rugs

## 2021-01-24 ENCOUNTER — Ambulatory Visit (INDEPENDENT_AMBULATORY_CARE_PROVIDER_SITE_OTHER): Payer: Self-pay | Admitting: Primary Care

## 2021-01-24 ENCOUNTER — Other Ambulatory Visit: Payer: Self-pay

## 2021-01-24 ENCOUNTER — Encounter (INDEPENDENT_AMBULATORY_CARE_PROVIDER_SITE_OTHER): Payer: Self-pay | Admitting: Primary Care

## 2021-01-24 ENCOUNTER — Telehealth: Payer: Self-pay | Admitting: Clinical

## 2021-01-24 VITALS — BP 138/94 | HR 74 | Temp 97.5°F | Ht 68.0 in | Wt 369.8 lb

## 2021-01-24 DIAGNOSIS — M25562 Pain in left knee: Secondary | ICD-10-CM

## 2021-01-24 DIAGNOSIS — I10 Essential (primary) hypertension: Secondary | ICD-10-CM

## 2021-01-24 DIAGNOSIS — M25561 Pain in right knee: Secondary | ICD-10-CM

## 2021-01-24 DIAGNOSIS — G8929 Other chronic pain: Secondary | ICD-10-CM

## 2021-01-24 NOTE — Telephone Encounter (Signed)
Called pt and scheduled appt per provider request for 01/27/21 at 3:00pm.
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
## 2021-01-27 ENCOUNTER — Telehealth (INDEPENDENT_AMBULATORY_CARE_PROVIDER_SITE_OTHER): Payer: Self-pay

## 2021-01-27 ENCOUNTER — Ambulatory Visit (INDEPENDENT_AMBULATORY_CARE_PROVIDER_SITE_OTHER): Payer: Self-pay | Admitting: Clinical

## 2021-01-27 ENCOUNTER — Other Ambulatory Visit: Payer: Self-pay

## 2021-01-27 DIAGNOSIS — F4323 Adjustment disorder with mixed anxiety and depressed mood: Secondary | ICD-10-CM

## 2021-01-27 NOTE — Telephone Encounter (Signed)
Patient dropped off  [employer] disability forms to be filled out by PCP. Patient is aware forms can take up 7 to 14 days to be completed.  Please advise patient when forms are ready to pick up.

## 2021-01-28 NOTE — BH Specialist Note (Signed)
Integrated Behavioral Health Initial In-Person Visit  MRN: [id number] Name: Tonya Barrett  Number of Integrated Behavioral Health Clinician visits:: 1/6 Session Start time: 3:05 PM Session End time: 4:05 PM Total time: 60 minutes  Types of Service: Individual psychotherapy  Interpretor:No. Interpretor Name and Language: N/A   Warm Hand Off Completed.         Subjective: Tonya Barrett is a 50 y.o. female accompanied by  Self Patient was referred by PCP Gwinda Passe for depression and anxiety. Patient reports the following symptoms/concerns: Reports feeling depressed, decreased interest in enjoyable activities, difficulty sleeping, decreased energy, overeating, decreased self-esteem, difficulty concentrating, fidgeting, feeling anxious, excessive worrying, difficulty relaxing, restlessness, and irritability.  Duration of problem: 1 year; Severity of problem: moderate  Objective: Mood: Anxious and Depressed and Affect: Appropriate and Tearful Risk of harm to self or others: No plan to harm self or others  Life Context: Family and Social: Reports that she is currently living with her son, son's fiance, and her grandchild. Reports that she has seven siblings who are supportive.  School/Work: Pt currently unemployed and does not have income. Pt currently in the process of applying for disability. Reports that she was working before the pandemic and attempted to go back but experienced heath difficulties. Reports that after having COVID she experienced shortness of breath when standing for extended periods. Reports that she also gained weight during the pandemic which she believes may have contributed to difficulty working and shortness of breath. Self-Care: Reports spending time with family and caring for grandchild as coping skill.  Life Changes: Reports that she has been out of work for several months and is having difficulty adjusting.  Patient and/or Family's  Strengths/Protective Factors: Social connections, Social and Emotional competence, Concrete supports in place (healthy food, safe environments, etc.), and Sense of purpose  Goals Addressed: Patient will: Reduce symptoms of: anxiety, depression, and stress Increase knowledge and/or ability of: coping skills, healthy habits, and stress reduction  Demonstrate ability to: Increase healthy adjustment to current life circumstances  Progress towards Goals: Ongoing  Interventions: Interventions utilized: Mindfulness or Management consultant, CBT Cognitive Behavioral Therapy, Supportive Counseling, Psychoeducation and/or Health Education, and Preventative Services/Health Promotion  Standardized Assessments completed:  MDQ, ASRS, GAD-7, and PHQ 9  Patient and/or Family Response: Pt receptive to tx and plan.   Patient Centered Plan: Patient is on the following Treatment Plan(s):  Pt will fu with LCSW and continue adhering to medication from PCP. Pt will also begin adjusting diet and exercising.   Assessment: Pt presents with depressed and anxious mood with an appropriate affect. Tearful during session. MDQ was positive. Denies SI/HI. Denies auditory/visual hallucinations. No safety risks. Pt reports that she has been out of work for several months due to health complications. Reports that she has worked for [employer] for 20 years. Reports that she stopped working briefly due to the pandemic and attempted to return to work last year but experienced difficulty. Reports that she experiences shortness of breath when standing/walking for extended times. Reports that she had to take extra breaks when she tried to work due to  breathing issues. Reports that she gained 40 lbs during the pandemic and she had COVID which she believes has contributed to her difficulty breathing. Reports that she wants to lose weight. States that she does not feel like her normal self. Reports that she often has a  depressed mood since being unable to work. Reports that she is applying for disability but ultimately  wants the ability to still be able to work part time. Reports that she plans to adjust her diet and begin walking. Patient currently experiencing an adjustment reaction due to not being able to work.   Patient may benefit from therapy and ongoing medication. Pt also may benefit from adjusting diet and exercising. LCSW assisted pt in short term goal setting in order to improve her mood and assist with losing weight. LCSW will fu with pt.  Plan: Follow up with behavioral health clinician on : 02/17/21 Behavioral recommendations: Utilize deep breathing exercises, begin adjusting diet, begin exercising Referral(s): Integrated [employer] (In Clinic) "From scale of 1-10, how likely are you to follow plan?": 10  Monta Police C Alfred Harrel, LCSW

## 2021-01-31 ENCOUNTER — Other Ambulatory Visit (INDEPENDENT_AMBULATORY_CARE_PROVIDER_SITE_OTHER): Payer: Self-pay | Admitting: Primary Care

## 2021-01-31 DIAGNOSIS — G8929 Other chronic pain: Secondary | ICD-10-CM

## 2021-01-31 DIAGNOSIS — M25562 Pain in left knee: Secondary | ICD-10-CM

## 2021-01-31 NOTE — Telephone Encounter (Signed)
Patient dropped off disability form unable to complete or determine what type of restrictions needed. Refer to physical therapy and make patient aware or ortho whichever the patient prefers

## 2021-01-31 NOTE — Telephone Encounter (Signed)
Pt was calling to follow up on the paperwork she dropped off.

## 2021-02-02 ENCOUNTER — Other Ambulatory Visit: Payer: Self-pay

## 2021-02-02 ENCOUNTER — Other Ambulatory Visit (INDEPENDENT_AMBULATORY_CARE_PROVIDER_SITE_OTHER): Payer: Self-pay

## 2021-02-04 LAB — CMP14+EGFR
ALT: 27 IU/L (ref 0–32)
AST: 22 IU/L (ref 0–40)
Albumin/Globulin Ratio: 1.6 (ref 1.2–2.2)
Albumin: 4.2 g/dL (ref 3.8–4.9)
Alkaline Phosphatase: 64 IU/L (ref 44–121)
BUN/Creatinine Ratio: 12 (ref 9–23)
BUN: 8 mg/dL (ref 6–24)
Bilirubin Total: 0.7 mg/dL (ref 0.0–1.2)
CO2: 22 mmol/L (ref 20–29)
Calcium: 9.1 mg/dL (ref 8.7–10.2)
Chloride: 105 mmol/L (ref 96–106)
Creatinine, Ser: 0.67 mg/dL (ref 0.57–1.00)
Globulin, Total: 2.7 g/dL (ref 1.5–4.5)
Glucose: 94 mg/dL (ref 65–99)
Potassium: 4.1 mmol/L (ref 3.5–5.2)
Sodium: 141 mmol/L (ref 134–144)
Total Protein: 6.9 g/dL (ref 6.0–8.5)
eGFR: 105 mL/min/1.73

## 2021-02-04 LAB — LIPID PANEL
Chol/HDL Ratio: 5 ratio — ABNORMAL HIGH (ref 0.0–4.4)
Cholesterol, Total: 234 mg/dL — ABNORMAL HIGH (ref 100–199)
HDL: 47 mg/dL (ref >39)
LDL Chol Calc (NIH): 170 mg/dL — ABNORMAL HIGH (ref 0–99)
Triglycerides: 94 mg/dL (ref 0–149)
VLDL Cholesterol Cal: 17 mg/dL (ref 5–40)

## 2021-02-04 LAB — RHEUMATOID ARTHRITIS PROFILE
Cyclic Citrullin Peptide Ab: 9 U (ref 0–19)
Rheumatoid fact SerPl-aCnc: <10 [IU]/mL

## 2021-02-07 ENCOUNTER — Other Ambulatory Visit (INDEPENDENT_AMBULATORY_CARE_PROVIDER_SITE_OTHER): Payer: Self-pay | Admitting: Primary Care

## 2021-02-07 DIAGNOSIS — E782 Mixed hyperlipidemia: Secondary | ICD-10-CM

## 2021-02-07 MED ORDER — ATORVASTATIN CALCIUM 40 MG PO TABS: 40.0000 mg | ORAL_TABLET | Freq: Every day | ORAL | 1 refills | Status: AC

## 2021-02-08 ENCOUNTER — Telehealth (INDEPENDENT_AMBULATORY_CARE_PROVIDER_SITE_OTHER): Payer: Self-pay

## 2021-02-08 NOTE — Telephone Encounter (Signed)
Patient verified date of birth. She is aware of elevated cholesterol which can lead to stroke and heart attack. Advised to take atorvastatin nightly. Advised to increase veggie and whole grain intake. Advised to decrease consumption of fatty food, red meat, cheese and milk. She verbalized understanding. Maryjean Morn, CMA

## 2021-02-08 NOTE — Telephone Encounter (Signed)
-----   Message from Grayce Sessions, NP sent at 02/07/2021  1:06 PM EDT ----- Your cholesterol is elevated and this can lead to heart attack and stroke. To lower your number you can decrease your fatty foods, red meat, cheese, milk and increase fiber like whole grains and veggies.  Take your atorvastatin nightly

## 2021-02-12 ENCOUNTER — Ambulatory Visit: Payer: Self-pay | Attending: Primary Care

## 2021-02-12 ENCOUNTER — Other Ambulatory Visit: Payer: Self-pay

## 2021-02-12 VITALS — BP 172/120 | HR 92

## 2021-02-12 DIAGNOSIS — G8929 Other chronic pain: Secondary | ICD-10-CM | POA: Insufficient documentation

## 2021-02-12 DIAGNOSIS — M25562 Pain in left knee: Secondary | ICD-10-CM | POA: Insufficient documentation

## 2021-02-12 DIAGNOSIS — M25561 Pain in right knee: Secondary | ICD-10-CM | POA: Insufficient documentation

## 2021-02-12 NOTE — Therapy (Signed)
[facility] [address] [place], [place], [address] Phone: [phone]   Fax:  [phone]  Patient Details  Name: KYMARI LOLLIS MRN: [id number] Date of Birth: 04/07/1971 Referring Provider:  Grayce Sessions, NP  Encounter Date: 02/12/2021       Ms Kuehnle's Bp was elevated beyond allowable by [employer] FCE protocol. She was rescheduled.   Caprice Red 02/12/2021, 8:56 AM  [facility] [address] [place], [place], [address] Phone: [phone]   Fax:  [phone]

## 2021-02-14 ENCOUNTER — Ambulatory Visit: Payer: Self-pay | Attending: Primary Care

## 2021-02-14 ENCOUNTER — Other Ambulatory Visit: Payer: Self-pay

## 2021-02-14 VITALS — BP 172/120 | HR 107

## 2021-02-14 DIAGNOSIS — M25562 Pain in left knee: Secondary | ICD-10-CM | POA: Insufficient documentation

## 2021-02-14 DIAGNOSIS — G8929 Other chronic pain: Secondary | ICD-10-CM | POA: Insufficient documentation

## 2021-02-14 DIAGNOSIS — M25561 Pain in right knee: Secondary | ICD-10-CM | POA: Insufficient documentation

## 2021-02-14 NOTE — Therapy (Signed)
[facility] [address] [place], [place], [address] Phone: [phone]   Fax:  [phone]  Patient Details  Name: Tonya Barrett MRN: [id number] Date of Birth: Aug 17, 1970 Referring Provider:  Grayce Sessions, NP  Encounter Date: 02/14/2021                                   Blood pressure was elevated again today (180/111) on arrival even with medication.  After 24 steps her BP was 200/127 and 4 min later. I asked her to contact PCP to assess BP and need for any changes. She will need clearance from MD to return.   Caprice Red 02/14/2021, 12:06 PM  [facility] [address] [place], [place], [address] Phone: [phone]   Fax:  [phone]

## 2021-02-17 ENCOUNTER — Ambulatory Visit (INDEPENDENT_AMBULATORY_CARE_PROVIDER_SITE_OTHER): Payer: Self-pay | Admitting: Clinical

## 2021-03-03 ENCOUNTER — Ambulatory Visit (INDEPENDENT_AMBULATORY_CARE_PROVIDER_SITE_OTHER): Payer: Self-pay | Admitting: Clinical

## 2021-03-03 ENCOUNTER — Other Ambulatory Visit: Payer: Self-pay

## 2021-03-03 DIAGNOSIS — F4323 Adjustment disorder with mixed anxiety and depressed mood: Secondary | ICD-10-CM

## 2021-03-03 NOTE — BH Specialist Note (Signed)
Integrated Behavioral Health Follow Up In-Person Visit  MRN: [id number] Name: Tonya Barrett  Number of Integrated Behavioral Health Clinician visits: 2/6 Session Start time: 9:11am  Session End time: 10:00am Total time:  49  minutes  Types of Service: Individual psychotherapy  Interpretor:No. Interpretor Name and Language: N/A  Subjective: Tonya Barrett is a 50 y.o. female accompanied by  self Patient was referred by PCP Edwards for anxiety and depression. Patient reports the following symptoms/concerns: Reports feeling depressed, decreased energy, self-esteem disturbances, difficulty concentrating, anxiousness, excessive worrying, irritability, and difficulty relaxing. Reports that she continues to adjust her diet but experiences difficulty with getting rid of certain foods. Reports that she is having difficulty adjusting to not working and having to rely on others to help financially.  Duration of problem: 1 year; Severity of problem: moderate  Objective: Mood: Anxious and Depressed and Affect: Appropriate Risk of harm to self or others: No plan to harm self or others  Life Context: Family and Social: Reports that she is currently living with her son, son's fiance, and her grandchild. Reports that she has seven siblings who are supportive.  School/Work: Pt currently unemployed and does not have income. Pt currently in the process of applying for disability. Reports that she was working before the pandemic and attempted to go back but experienced heath difficulties. Reports that after having COVID she experienced shortness of breath when standing for extended periods. Reports that she also gained weight during the pandemic which she believes may have contributed to difficulty working and shortness of breath.  Self-Care: Reports spending time with family and caring for grandchild as coping skill.  Life Changes: Pt has been out of work for several months and is having difficulty  adjusting. 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 Pt plans to work no more than 20 hours when substituting but is experiencing worry with moving slower than other coworkers. Pt also is worried about her managers not being happy with her moving slower or needing more breaks. Pt also reports that she has a new grandchild.  Patient and/or Family's Strengths/Protective Factors: Social connections, Social and Emotional competence, Concrete supports in place (healthy food, safe environments, etc.), and Sense of purpose  Goals Addressed: Patient will:  Reduce symptoms of: anxiety, depression, and stress   Increase knowledge and/or ability of: coping skills, healthy habits, and stress reduction   Demonstrate ability to: Increase healthy adjustment to current life circumstances  Progress towards Goals: Ongoing  Interventions: Interventions utilized:  Mindfulness or Management consultant, CBT Cognitive Behavioral Therapy, Medication Monitoring, Supportive Counseling, and Psychoeducation and/or Health Education Standardized Assessments completed: GAD-7 and PHQ 9  Patient and/or Family Response: Pt receptive to tx. Pt receptive to psychoeducation provided on the association between mental health and physical health. Pt receptive to cognitive restructuring in order to improve cognitive processing skills. Pt plans to utilize deep breathing exercises and journaling as pt worries excessively and assumes the worst. Pt continues to adhere to medication but does not believe it is helping. Pt continues to adjust diet and do exercises.   Patient Centered Plan: Patient is on the following Treatment Plan(s): Stress, Depression, and Anxiety  Assessment: Denies SI/HI. Denies auditory/visual hallucinations. Patient currently experiencing adjustment reaction to health changes. Pt experiencing excessive worry about returning to work part-time and  for substitution only. Pt appears to experience guilt related to physical health. Pt has difficulty with allowing others to support her as  she has a reliable support system. Pt appears to continue to be motivated for adjusting diet and exercising.   Patient may benefit from ongoing psychoeducation on cognitive restructuring to improve cognitive processing skills and decrease worrying. LCSWA attempted to normalize pt's difficulty with allowing people to support her. LCSWA encouraged pt to incorporate journaling in order to decrease worrying and stress related to work and her physical health. LCSWA will inform provider of pt's concerns with current medication being ineffective. LCSWA encouraged pt to continue adjusting her diet and exercising. LCSWA will fu with pt.   Plan: Follow up with behavioral health clinician on : 03/24/21 Behavioral recommendations: Utilize deep breathing exercises, utilize journaling, continue adjusting diet and exercising by drinking 64 oz of water and eating at least one healthy meal/day Referral(s): Integrated [employer] (In Clinic) "From scale of 1-10, how likely are you to follow plan?": 10  Jourdan Maldonado C Kalan Yeley, LCSW

## 2021-03-09 NOTE — Progress Notes (Signed)
Please see note from CSW.

## 2021-03-10 ENCOUNTER — Telehealth: Payer: Self-pay | Admitting: Physical Therapy

## 2021-03-10 NOTE — Telephone Encounter (Signed)
I spoke to Tonya Barrett and she has not been to her MD yet . She agreed she would call when ready to return for FCE testing

## 2021-03-24 ENCOUNTER — Ambulatory Visit (INDEPENDENT_AMBULATORY_CARE_PROVIDER_SITE_OTHER): Payer: Self-pay | Admitting: Clinical

## 2021-03-24 DIAGNOSIS — F4323 Adjustment disorder with mixed anxiety and depressed mood: Secondary | ICD-10-CM

## 2021-03-24 NOTE — BH Specialist Note (Signed)
Integrated Behavioral Health via Telemedicine Visit  03/24/2021 NALIYA GISH [id number]  Number of Integrated Behavioral Health visits: 3/6 Session Start time: 3:45pm  Session End time: 4:27pm Total time:  42 minutes  Referring Provider: PCP Randa Evens Patient/Family location: Home [facility] Provider location: [facility] Office All persons participating in visit: Pt and LCSWA Types of Service: Individual psychotherapy and Video visit  I connected with [name] via Video Enabled Telemedicine Application  (Video is Caregility application) and verified that I am speaking with the correct person using two identifiers. Discussed confidentiality: Yes   I discussed the limitations of telemedicine and the availability of in person appointments.  Discussed there is a possibility of technology failure and discussed alternative modes of communication if that failure occurs.  I discussed that engaging in this telemedicine visit, they consent to the provision of behavioral healthcare and the services will be billed under their insurance.  Patient and/or legal guardian expressed understanding and consented to Telemedicine visit: Yes   Presenting Concerns: Patient and/or family reports the following symptoms/concerns: Reports feeling depressed, decreased energy, difficulty concentrating, fidgeting, anxiousness, excessive worrying, and difficulty relaxing. Reports that she often feels like a burden to her family due to living with them and not having income. Reports that she plans to start working about 4 hours/week and is worried about how they will perceive her with health challenges. Reports that she continues to work on improving her physical health by walking and adjusting diet.  Duration of problem: 1 year; Severity of problem: moderate  Patient and/or Family's Strengths/Protective Factors: Social connections, Social and Emotional competence, Concrete supports in place (healthy food, safe  environments, etc.), and Sense of purpose  Goals Addressed: Patient will:  Reduce symptoms of: anxiety, depression, and stress   Increase knowledge and/or ability of: coping skills, healthy habits, and stress reduction   Demonstrate ability to: Increase healthy adjustment to current life circumstances  Progress towards Goals: Ongoing  Interventions: Interventions utilized:  Mindfulness or Management consultant, CBT Cognitive Behavioral Therapy, Medication Monitoring, and Psychoeducation and/or Health Education Standardized Assessments completed: GAD-7 and PHQ 9  Patient and/or Family Response: Pt receptive to tx. Pt receptive to psychoeducation provided on depression and anxiety and it's association with physical health. Pt receptive to cognitive restructuring utilized to decrease pt worries with her family. Pt will continue adhering to medication, utilize deep breathing exercises, and continue exercising. Pt has not been journaling.   Assessment: Denies SI/HI. Denies auditory/visual hallucinations. No safety risks. Patient currently experiencing continued adjustment reaction to health changes. Pt is experiencing worries with her family and returning to work. Pt appears to assume the worst and has difficulty with cognitive processing skills. Pt is progressing with her  physical health as she continues to exercise and adjust her diet. Pt appears motivated for her health. Pt also is continuing to apply for disability.   Patient may benefit from ongoing psychoeducation and cognitive restructuring. LCSWA provided psychoeducation on anxiety and depression and its association with physical health. LCSWA utilized cognitive restructuring to decrease pt worries and improve cognitive restructuring. Pt also continues to adhere to medication. LCSWA will fu with pt.  Plan: Follow up with behavioral health clinician on : 04/14/21 Behavioral recommendations: Continue adhering to medication, utilize deep  breathing exercises, and continue exercising and adjusting diet.  Referral(s): Integrated [employer] (In Clinic)  I discussed the assessment and treatment plan with the patient and/or parent/guardian. They were provided an opportunity to ask questions and all were answered. They agreed  with the plan and demonstrated an understanding of the instructions.   They were advised to call back or seek an in-person evaluation if the symptoms worsen or if the condition fails to improve as anticipated.  Andreea Arca C Lariza Cothron, LCSW

## 2021-04-14 ENCOUNTER — Ambulatory Visit (INDEPENDENT_AMBULATORY_CARE_PROVIDER_SITE_OTHER): Payer: Self-pay | Admitting: Clinical

## 2021-05-05 ENCOUNTER — Ambulatory Visit (INDEPENDENT_AMBULATORY_CARE_PROVIDER_SITE_OTHER): Payer: Self-pay | Admitting: Clinical

## 2021-05-05 ENCOUNTER — Other Ambulatory Visit: Payer: Self-pay

## 2021-05-05 DIAGNOSIS — F4323 Adjustment disorder with mixed anxiety and depressed mood: Secondary | ICD-10-CM

## 2021-05-13 NOTE — BH Specialist Note (Signed)
Integrated Behavioral Health via Telemedicine Visit  05/05/2021 MERRILY TEGELER [id number]  Number of Integrated Behavioral Health visits: 4/6 Session Start time: 11:30am  Session End time: 12:00pm Total time: 30  Referring Provider: PCP Gwinda Passe, NP Patient/Family location: Home [facility] Provider location: [facility] All persons participating in visit: Pt and LCSWA Types of Service: Individual psychotherapy and Video visit  I connected with [name] via Video Enabled Telemedicine Application  (Video is Caregility application) and verified that I am speaking with the correct person using two identifiers. Discussed confidentiality: Yes   I discussed the limitations of telemedicine and the availability of in person appointments.  Discussed there is a possibility of technology failure and discussed alternative modes of communication if that failure occurs.  I discussed that engaging in this telemedicine visit, they consent to the provision of behavioral healthcare and the services will be billed under their insurance.  Patient and/or legal guardian expressed understanding and consented to Telemedicine visit: Yes   Presenting Concerns: Patient and/or family reports the following symptoms/concerns: Reports feeling depressed, decreased interest in activities, trouble sleeping, decreased energy, overeating, self-esteem disturbances, trouble concentrating, anxiousness, worrying, trouble relaxing, irritability, and restlessness. Reports continuing to feel like a burden on her family. Reports that she has started working about 4 hours/week. Reports that she is continuing to work on her physical health by exercising.. Duration of problem: 1 year; Severity of problem: moderate  Patient and/or Family's Strengths/Protective Factors: Social connections, Social and Emotional competence, Concrete supports in place (healthy food, safe environments, etc.), and Sense of purpose  Goals  Addressed: Patient will:  Reduce symptoms of: anxiety, depression, and stress   Increase knowledge and/or ability of: coping skills, healthy habits, and stress reduction   Demonstrate ability to: Increase healthy adjustment to current life circumstances  Progress towards Goals: Ongoing  Interventions: Interventions utilized:  Mindfulness or Management consultant, CBT Cognitive Behavioral Therapy, Supportive Counseling, and Psychoeducation and/or Health Education Standardized Assessments completed: GAD-7 and PHQ 9 GAD 7 : Generalized Anxiety Score 05/05/2021 03/24/2021 03/03/2021 01/27/2021  Nervous, Anxious, on Edge 2 2 2 2   Control/stop worrying 3 3 2 3   Worry too much - different things 3 2 3 3   Trouble relaxing 2 2 2 2   Restless 2 2 2 2   Easily annoyed or irritable 2 2 1 2   Afraid - awful might happen 3 2 2 2   Total GAD 7 Score 17 15 14 16   Anxiety Difficulty - - - -     Depression screen [facility] 2/9 05/05/2021 03/24/2021 03/03/2021 01/27/2021 01/24/2021  Decreased Interest 2 2 2 2 2   Down, Depressed, Hopeless 2 2 2 1 2   PHQ - 2 Score 4 4 4 3 4   Altered sleeping 2 2 1 2 2   Tired, decreased energy 3 3 2 3 2   Change in appetite 2 2 2 3 2   Feeling bad or failure about yourself  2 2 1 2 2   Trouble concentrating 3 3 3 2 2   Moving slowly or fidgety/restless 2 3 2 2 2   Suicidal thoughts 0 0 0 0 2  PHQ-9 Score 18 19 15 17 18   Difficult doing work/chores - - - - Very difficult  Some recent data might be hidden    Patient and/or Family Response: Pt receptive to tx. Pt receptive to psycheoducation provided on depression and anxiety and the benefits of exercising. Pt receptive to cognitive restructuring to decrease negative thoughts and assist with cognitive processing skills. Pt receptive to adhering  to medication, continuing exercises, utilizing deep breathing exercise, and utilizing positive affirmations. Pt has not utilized journalling.   Assessment: Denies SI/HI. Denies auditory/visual  hallucinations. No safety risks. Patient currently experiencing depression and anxiety related to physical health. Pt appears to continue to be motivated to change physical health with exercising. Pt has difficulty self-esteem which contributes to pt feeling like a burden on family.   Patient may benefit from continuing brief therapy. LCSWA provided psycheoducation on depression and anxiety and its association with physical health. LCSWA utilized cognitive restructuring to decrease negative thoughts and assist with cognitive processing skills. LCSWA encouraged pt to utilize positive affirmations and utilize deep breathing exercises. LCSWA will fu with pt.  Plan: Follow up with behavioral health clinician on : 05/19/21 Behavioral recommendations: Utilize positive affirmations, continue exercising and adjusting diet, and deep breathing exercises. Referral(s): Integrated [employer] (In Clinic)  I discussed the assessment and treatment plan with the patient and/or parent/guardian. They were provided an opportunity to ask questions and all were answered. They agreed with the plan and demonstrated an understanding of the instructions.   They were advised to call back or seek an in-person evaluation if the symptoms worsen or if the condition fails to improve as anticipated.  Tessi Eustache C Tavaria Mackins, LCSW

## 2021-05-19 ENCOUNTER — Other Ambulatory Visit: Payer: Self-pay

## 2021-05-19 ENCOUNTER — Ambulatory Visit (INDEPENDENT_AMBULATORY_CARE_PROVIDER_SITE_OTHER): Payer: Self-pay | Admitting: Clinical

## 2021-05-19 DIAGNOSIS — F4323 Adjustment disorder with mixed anxiety and depressed mood: Secondary | ICD-10-CM

## 2021-05-20 NOTE — BH Specialist Note (Signed)
Integrated Behavioral Health via Telemedicine Visit  05/19/2021 Tonya Barrett [id number]  Number of Integrated Behavioral Health visits: 5/6 Session Start time: 11:35am  Session End time: 12:05pm Total time: 30  Referring Provider: Gwinda Passe, NP Patient/Family location: Vehicle [facility] Provider location: [facility] Office  All persons participating in visit: Pt and LCSWA Types of Service: Individual psychotherapy  I connected with Tonya Barrett via Video Enabled Telemedicine Application  (Video is Caregility application) and verified that I am speaking with the correct person using two identifiers. Discussed confidentiality: Yes   I discussed the limitations of telemedicine and the availability of in person appointments.  Discussed there is a possibility of technology failure and discussed alternative modes of communication if that failure occurs.  I discussed that engaging in this telemedicine visit, they consent to the provision of behavioral healthcare and the services will be billed under their insurance.  Patient and/or legal guardian expressed understanding and consented to Telemedicine visit: Yes   Presenting Concerns: Patient and/or family reports the following symptoms/concerns: Reports feeling depressed, decreased interest in activities, trouble sleeping, decreased energy, overeating, self-esteem disturbances, trouble concentrating, anxiousness, worrying, trouble relaxing, irritability, and restlessness. Reports that she continues to work about 4 hours/week. Reports that she is continuing exercises at home. Reports feeling bad about not being able to work as much. Reports difficulty adjustment to being at home more.  Duration of problem: 1 year; Severity of problem: moderate  Patient and/or Family's Strengths/Protective Factors: Social connections, Social and Emotional competence, Concrete supports in place (healthy food, safe environments, etc.), and Sense of  purpose  Goals Addressed: Patient will:  Reduce symptoms of: anxiety, depression, and stress   Increase knowledge and/or ability of: coping skills, healthy habits, and stress reduction   Demonstrate ability to: Increase healthy adjustment to current life circumstances  Progress towards Goals: Ongoing  Interventions: Interventions utilized:  Mindfulness or Management consultant, CBT Cognitive Behavioral Therapy, Supportive Counseling, and Psychoeducation and/or Health Education Standardized Assessments completed: GAD-7 and PHQ 9 GAD 7 : Generalized Anxiety Score 05/19/2021 05/05/2021 03/24/2021 03/03/2021  Nervous, Anxious, on Edge 2 2 2 2   Control/stop worrying 2 3 3 2   Worry too much - different things 3 3 2 3   Trouble relaxing 2 2 2 2   Restless 2 2 2 2   Easily annoyed or irritable 2 2 2 1   Afraid - awful might happen 3 3 2 2   Total GAD 7 Score 16 17 15 14   Anxiety Difficulty - - - -     Depression screen [facility] 2/9 05/19/2021 05/05/2021 03/24/2021 03/03/2021 01/27/2021  Decreased Interest 2 2 2 2 2   Down, Depressed, Hopeless 2 2 2 2 1   PHQ - 2 Score 4 4 4 4 3   Altered sleeping 2 2 2 1 2   Tired, decreased energy 3 3 3 2 3   Change in appetite 2 2 2 2 3   Feeling bad or failure about yourself  2 2 2 1 2   Trouble concentrating 3 3 3 3 2   Moving slowly or fidgety/restless 2 2 3 2 2   Suicidal thoughts 0 0 0 0 0  PHQ-9 Score 18 18 19 15 17   Difficult doing work/chores - - - - -  Some recent data might be hidden    Patient and/or Family Response: Pt receptive to tx. Pt receptive to psychoeducation provided on anxiety. Pt receptive to affirmation provided for pt's progress and cognitive restructuring. Pt will consider a memebership with [employer] in order to leave home more. Pt  receptive continuing exercises, deep breathing exercises, utilizing positive affirmations, and adhering to medication.   Assessment: Denies SI/HI. Denies auditory/visual hallucinations. No safety risks. Patient currently  experiencing depression and anxiety related to physical health. Pt also experiencing difficulty with not working as much and spending more time at home. Pt's cognitive processing skills are improving.   Patient may benefit from spending time outside of home. LCSWA provided psychoeducation on anxiety. LCSWA provided affirmation for pt's progress and attempted to normalize pt's difficulty adjusting. LCSWA utilized Chartered certified accountant. LCSWA enocouraged pt to continue exercising, deep breathing exercises, utilizing positive affirmations, and adhering to medication. LCSWA also provided information on [employer] as pt exercises and wants to leave the home more. LCSWA will fu with pt.  Plan: Follow up with behavioral health clinician on : 06/16/21 Behavioral recommendations: Utilize deep breathing exercises, positive affirmations, exercising, and continue adhering to medication Referral(s): Integrated [employer] (In Clinic)  I discussed the assessment and treatment plan with the patient and/or parent/guardian. They were provided an opportunity to ask questions and all were answered. They agreed with the plan and demonstrated an understanding of the instructions.   They were advised to call back or seek an in-person evaluation if the symptoms worsen or if the condition fails to improve as anticipated.  Danyon Mcginness C Caid Radin, LCSW

## 2021-06-16 ENCOUNTER — Ambulatory Visit (INDEPENDENT_AMBULATORY_CARE_PROVIDER_SITE_OTHER): Payer: Self-pay | Admitting: Clinical

## 2021-06-16 ENCOUNTER — Other Ambulatory Visit: Payer: Self-pay

## 2021-06-16 DIAGNOSIS — F4323 Adjustment disorder with mixed anxiety and depressed mood: Secondary | ICD-10-CM

## 2021-06-17 NOTE — BH Specialist Note (Signed)
Integrated Behavioral Health via Telemedicine Visit  06/16/2021 TAGEN BRETHAUER [id number]  Number of Integrated Behavioral Health visits: 6 Session Start time: 11:35am  Session End time: 12:05pm Total time: 30  Referring Provider: Gwinda Passe, NP Patient/Family location: Home [facility] Provider location: [facility] Office All persons participating in visit: Pt and LCSWA Types of Service: Individual psychotherapy  I connected with [name] via Video Enabled Telemedicine Application  (Video is Caregility application) and verified that I am speaking with the correct person using two identifiers. Discussed confidentiality: Yes   I discussed the limitations of telemedicine and the availability of in person appointments.  Discussed there is a possibility of technology failure and discussed alternative modes of communication if that failure occurs.  I discussed that engaging in this telemedicine visit, they consent to the provision of behavioral healthcare and the services will be billed under their insurance.  Patient and/or legal guardian expressed understanding and consented to Telemedicine visit: Yes   Presenting Concerns: Patient and/or family reports the following symptoms/concerns: Reports feeling depressed, trouble sleeping, decreased interest in activities, decreased energy, overeating, self-esteem disturbances, trouble concentrating, anxiousness, excessive worrying, restlessness, and irritability. Reports that she had a visit with doctor for disability and learned that she has severe arthritis and problems with her knee. Reports that she has not been feeling like a burden on her family anymore.  Duration of problem: 1 year; Severity of problem: moderate  Patient and/or Family's Strengths/Protective Factors: Social connections, Social and Emotional competence, Concrete supports in place (healthy food, safe environments, etc.), and Sense of purpose  Goals Addressed: Patient  will:  Reduce symptoms of: anxiety, depression, and stress   Increase knowledge and/or ability of: coping skills, healthy habits, and stress reduction   Demonstrate ability to: Increase healthy adjustment to current life circumstances  Progress towards Goals: Ongoing  Interventions: Interventions utilized:  Mindfulness or Management consultant, CBT Cognitive Behavioral Therapy, and Supportive Counseling Standardized Assessments completed: GAD-7 and PHQ 9 GAD 7 : Generalized Anxiety Score 06/16/2021 05/19/2021 05/05/2021 03/24/2021  Nervous, Anxious, on Edge 2 2 2 2   Control/stop worrying 3 2 3 3   Worry too much - different things 3 3 3 2   Trouble relaxing 2 2 2 2   Restless 2 2 2 2   Easily annoyed or irritable 2 2 2 2   Afraid - awful might happen 2 3 3 2   Total GAD 7 Score 16 16 17 15   Anxiety Difficulty - - - -     Depression screen [facility] 2/9 06/16/2021 05/19/2021 05/05/2021 03/24/2021 03/03/2021  Decreased Interest 2 2 2 2 2   Down, Depressed, Hopeless 2 2 2 2 2   PHQ - 2 Score 4 4 4 4 4   Altered sleeping 2 2 2 2 1   Tired, decreased energy 2 3 3 3 2   Change in appetite 2 2 2 2 2   Feeling bad or failure about yourself  2 2 2 2 1   Trouble concentrating 3 3 3 3 3   Moving slowly or fidgety/restless 2 2 2 3 2   Suicidal thoughts 0 0 0 0 0  PHQ-9 Score 17 18 18 19 15   Difficult doing work/chores - - - - -  Some recent data might be hidden    Patient and/or Family Response: Pt receptive to tx. Pt receptive to psychoeucation provided on depression and anxiety. Pt receptive to empowerment in order to improve motivation. Pt receptive to affirmation provided for pt's progress as she is walking and planning to go on a longer walk  with family this weekend. Pt receptive to cognitive restructuring. Pt receptive to deep breathing exercises and utilizing positive affirmations.  Assessment: Denies SI/HI. Denies auditory/visual hallucinations. No safety risks. Patient currently experiencing depression and  anxiety related to physical health. Pt's coping skills are improving as she is walking more and spending time with family. Pt appears hopeful since meeting with doctor for disability.   Patient may benefit from continuing healthy coping skills. LCSWA provided psychoeducation on depression and anxiety. LCSWA provided empowerment and affirmation for pt's progress. LCSWA utilized Chartered certified accountant. LCSWA encouraged pt to continue healthy coping skills with walking, deep breathing, and positive affirmations.   Plan: Follow up with behavioral health clinician on : 07/21/21 Behavioral recommendations: Continue healthy coping skills Referral(s): Integrated [employer] (In Clinic)  I discussed the assessment and treatment plan with the patient and/or parent/guardian. They were provided an opportunity to ask questions and all were answered. They agreed with the plan and demonstrated an understanding of the instructions.   They were advised to call back or seek an in-person evaluation if the symptoms worsen or if the condition fails to improve as anticipated.  Jolan Upchurch C Tyshia Fenter, LCSW

## 2021-08-18 ENCOUNTER — Telehealth (INDEPENDENT_AMBULATORY_CARE_PROVIDER_SITE_OTHER): Payer: Self-pay

## 2021-08-18 NOTE — Telephone Encounter (Signed)
Copied from [place] [id number]. Topic: Appointment Scheduling - Scheduling Inquiry for Clinic >> Aug 18, 2021 10:22 AM Alanda Slim E wrote: Reason for CRM: Pt would like to schedule an appt with [facility] / please advise

## 2021-09-02 NOTE — Telephone Encounter (Signed)
I spoke with pt and scheduled an appt for 09/22/21.

## 2021-09-22 ENCOUNTER — Ambulatory Visit (INDEPENDENT_AMBULATORY_CARE_PROVIDER_SITE_OTHER): Payer: Self-pay | Admitting: Clinical

## 2021-09-22 ENCOUNTER — Other Ambulatory Visit: Payer: Self-pay

## 2021-09-22 DIAGNOSIS — F331 Major depressive disorder, recurrent, moderate: Secondary | ICD-10-CM

## 2021-09-22 NOTE — BH Specialist Note (Signed)
Integrated Behavioral Health via Telemedicine Visit ? ?09/22/2021 ?[name] ?[id number] ? ?Number of Integrated Behavioral Health Clinician visits: Additional Visit ? ?Session Start time: 1115 ?  ?Session End time: 1200 ? ?Total time in minutes: 45 ? ? ?Referring Provider: Gwinda Passe, NP ?Patient/Family location: Home ?[facility] Provider location: [facility] office ?All persons participating in visit: Pt and LCSW ?Types of Service: Individual psychotherapy ? ?I connected with [name] via Video Enabled Telemedicine Application  (Video is Caregility application) and verified that I am speaking with the correct person using two identifiers. Discussed confidentiality: Yes  ? ?I discussed the limitations of telemedicine and the availability of in person appointments.  Discussed there is a possibility of technology failure and discussed alternative modes of communication if that failure occurs. ? ?I discussed that engaging in this telemedicine visit, they consent to the provision of behavioral healthcare and the services will be billed under their insurance. ? ?Patient and/or legal guardian expressed understanding and consented to Telemedicine visit: Yes  ? ?Presenting Concerns: ?Patient and/or family reports the following symptoms/concerns: Reports feeling depressed at times and self-esteem disturbances. Reports that she has been improving her activity by going for walks and she has been adjusting her eating. Reports that she should be receiving a hearing on her disability by the end of the month. Reports that her family makes her feel bad for not working right now however her son and daughter-in-law continue to support her. Reports that she also worries about her arthritis. ?Duration of problem: 1 year; Severity of problem: moderate ? ?Patient and/or Family's Strengths/Protective Factors: ?Social connections, Social and Emotional competence, and Concrete supports in place (healthy food, safe environments,  etc.) ? ?Goals Addressed: ?Patient will: ? Reduce symptoms of: anxiety and depression  ? Increase knowledge and/or ability of: coping skills  ? Demonstrate ability to: Increase healthy adjustment to current life circumstances ? ?Progress towards Goals: ?Ongoing ? ?Interventions: ?Interventions utilized:  Mining engineer, CBT Cognitive Behavioral Therapy, and Supportive Counseling ?Standardized Assessments completed: Not Needed ? ?Patient and/or Family Response: Pt receptive to tx. Pt receptive to psychoeducation provided on depression. Pt receptive to cognitive restructuring. Pt receptive to establishing healthy boundaries with family. Pt receptive to continue exercising and adjusting eating habits. ? ?Assessment: ?Denies SI/HI. Patient currently experiencing depression. Pt appears to have difficulty with boundaries with family. Pt continues to improve her exercising by walking twice/week.  ? ?Patient may benefit from establishing boundaries with family. LCSW provided psychoeducation on depression. LCSW utilized cognitive restructuring to decrease negative thoughts. LCSW provided assistance with pt establishing healthy boundaries with family. LCSW encouraged pt to continue exercising and adjusting eating habits. ? ?Plan: ?Follow up with behavioral health clinician on : 10/06/21 ?Behavioral recommendations: Establish healthy boundaries with family and continue exercising and adjusting eating habits. ?Referral(s): Integrated [employer] (In Clinic) ? ?I discussed the assessment and treatment plan with the patient and/or parent/guardian. They were provided an opportunity to ask questions and all were answered. They agreed with the plan and demonstrated an understanding of the instructions. ?  ?They were advised to call back or seek an in-person evaluation if the symptoms worsen or if the condition fails to improve as anticipated. ? ?Nusrat Encarnacion C Grissel Tyrell, LCSW ?

## 2021-10-06 ENCOUNTER — Ambulatory Visit (INDEPENDENT_AMBULATORY_CARE_PROVIDER_SITE_OTHER): Payer: Self-pay | Admitting: Clinical

## 2021-10-06 ENCOUNTER — Other Ambulatory Visit: Payer: Self-pay

## 2021-10-06 DIAGNOSIS — F331 Major depressive disorder, recurrent, moderate: Secondary | ICD-10-CM

## 2021-10-12 NOTE — BH Specialist Note (Signed)
Integrated Behavioral Health via Telemedicine Visit ? ?10/06/21 ?Tonya Barrett ?[id number] ? ?Number of Integrated Behavioral Health Clinician visits: Additional Visit ? ?Session Start time: 1345 ?  ?Session End time: 1400 ? ?Total time in minutes: 15 ? ? ?Referring Provider: Gwinda Passe, NP ?Patient/Family location: Home ?[facility] Provider location: [facility] office ?All persons participating in visit: Pt and LCSW ?Types of Service: Individual psychotherapy ? ?I connected with Tonya Barrett via Telephone and verified that I am speaking with the correct person using two identifiers. Discussed confidentiality: Yes  ? ?I discussed the limitations of telemedicine and the availability of in person appointments.  Discussed there is a possibility of technology failure and discussed alternative modes of communication if that failure occurs. ? ?I discussed that engaging in this telemedicine visit, they consent to the provision of behavioral healthcare and the services will be billed under their insurance. ? ?Patient and/or legal guardian expressed understanding and consented to Telemedicine visit: Yes  ? ?Presenting Concerns: ?Patient and/or family reports the following symptoms/concerns: Reports that she continues to improve her self-esteem by exercising. Reports that she is still waiting on her disability but that they have already made a decision. Reports that she limits her interactions with her family aside from her son and daughter-in-law. Reports that her family frequently makes her feel bad and they have not been supportive.  ?Duration of problem: 1 yearr; Severity of problem: moderate ? ?Patient and/or Family's Strengths/Protective Factors: ?Social connections, Social and Emotional competence, and Concrete supports in place (healthy food, safe environments, etc.) ? ?Goals Addressed: ?Patient will: ? Reduce symptoms of: anxiety and depression  ? Increase knowledge and/or ability of: coping skills  ? Demonstrate  ability to: Increase healthy adjustment to current life circumstances ? ?Progress towards Goals: ?Ongoing ? ?Interventions: ?Interventions utilized:  CBT Cognitive Behavioral Therapy and Supportive Counseling ?Standardized Assessments completed: Not Needed ? ?Patient and/or Family Response: Pt receptive to tx. Pt receptive to continuing to establish boundaries with family.  ? ?Assessment: ?Denies SI/HI. Patient currently experiencing depression however her mood appears to be improving. Pt continues to remain active to improve her physical and mental health.  ? ?Patient may benefit from continuing to establish boundaries with family. LCSW utilized Chartered certified accountant. LCSW encouraged pt to continue establishing boundaries with family. ? ?Plan: ?Follow up with behavioral health clinician on : 10/27/21 ?Behavioral recommendations: Continue establishing boundaries with family. ?Referral(s): Integrated [employer] (In Clinic) ? ?I discussed the assessment and treatment plan with the patient and/or parent/guardian. They were provided an opportunity to ask questions and all were answered. They agreed with the plan and demonstrated an understanding of the instructions. ?  ?They were advised to call back or seek an in-person evaluation if the symptoms worsen or if the condition fails to improve as anticipated. ? ?Edra Riccardi C Adain Geurin, LCSW ?

## 2021-10-27 ENCOUNTER — Encounter (INDEPENDENT_AMBULATORY_CARE_PROVIDER_SITE_OTHER): Payer: Self-pay | Admitting: Clinical

## 2023-01-30 ENCOUNTER — Ambulatory Visit (INDEPENDENT_AMBULATORY_CARE_PROVIDER_SITE_OTHER): Payer: Self-pay | Admitting: Primary Care

## 2023-02-23 ENCOUNTER — Telehealth (INDEPENDENT_AMBULATORY_CARE_PROVIDER_SITE_OTHER): Payer: Self-pay | Admitting: Primary Care

## 2023-02-23 NOTE — Telephone Encounter (Signed)
Spoke to pt. Will be at apt.  

## 2023-02-26 ENCOUNTER — Ambulatory Visit (INDEPENDENT_AMBULATORY_CARE_PROVIDER_SITE_OTHER): Payer: Self-pay | Admitting: Primary Care

## 2023-04-03 ENCOUNTER — Ambulatory Visit (INDEPENDENT_AMBULATORY_CARE_PROVIDER_SITE_OTHER): Payer: Self-pay | Admitting: Primary Care

## 2023-05-28 DIAGNOSIS — I10 Essential (primary) hypertension: Secondary | ICD-10-CM | POA: Diagnosis not present

## 2023-05-28 DIAGNOSIS — Z8249 Family history of ischemic heart disease and other diseases of the circulatory system: Secondary | ICD-10-CM | POA: Diagnosis not present

## 2023-05-28 DIAGNOSIS — G8929 Other chronic pain: Secondary | ICD-10-CM | POA: Diagnosis not present

## 2023-05-28 DIAGNOSIS — I739 Peripheral vascular disease, unspecified: Secondary | ICD-10-CM | POA: Diagnosis not present

## 2023-05-28 DIAGNOSIS — Z809 Family history of malignant neoplasm, unspecified: Secondary | ICD-10-CM | POA: Diagnosis not present

## 2023-05-28 DIAGNOSIS — Z6841 Body Mass Index (BMI) 40.0 and over, adult: Secondary | ICD-10-CM | POA: Diagnosis not present

## 2023-05-28 DIAGNOSIS — Z791 Long term (current) use of non-steroidal anti-inflammatories (NSAID): Secondary | ICD-10-CM | POA: Diagnosis not present
